# Patient Record
Sex: Female | Born: 1970 | Race: Black or African American | Hispanic: No | Marital: Married | Smoking: Never smoker
Health system: Southern US, Community
[De-identification: ages and names within clinical notes are randomized; demographics above are authoritative.]

---

## 2013-06-15 ENCOUNTER — Ambulatory Visit: Payer: Self-pay | Admitting: Family Medicine

## 2014-08-02 ENCOUNTER — Other Ambulatory Visit (HOSPITAL_BASED_OUTPATIENT_CLINIC_OR_DEPARTMENT_OTHER): Payer: Self-pay | Admitting: *Deleted

## 2014-08-02 ENCOUNTER — Ambulatory Visit (HOSPITAL_BASED_OUTPATIENT_CLINIC_OR_DEPARTMENT_OTHER): Payer: Self-pay

## 2014-08-02 ENCOUNTER — Ambulatory Visit
Admission: RE | Admit: 2014-08-02 | Discharge: 2014-08-02 | Disposition: A | Discharge status: Home / Self Care | Payer: BLUE CROSS/BLUE SHIELD | Source: Ambulatory Visit | Attending: Family Medicine | Admitting: Family Medicine

## 2014-08-02 ENCOUNTER — Other Ambulatory Visit: Payer: Self-pay | Admitting: Family Medicine

## 2014-08-02 DIAGNOSIS — Z1231 Encounter for screening mammogram for malignant neoplasm of breast: Secondary | ICD-10-CM

## 2015-07-13 ENCOUNTER — Other Ambulatory Visit: Payer: Self-pay | Admitting: Family Medicine

## 2015-07-13 DIAGNOSIS — Z1231 Encounter for screening mammogram for malignant neoplasm of breast: Secondary | ICD-10-CM

## 2015-08-03 ENCOUNTER — Ambulatory Visit
Admission: RE | Admit: 2015-08-03 | Discharge: 2015-08-03 | Disposition: A | Discharge status: Home / Self Care | Payer: BLUE CROSS/BLUE SHIELD | Source: Ambulatory Visit | Attending: Family Medicine | Admitting: Family Medicine

## 2015-08-03 DIAGNOSIS — Z1231 Encounter for screening mammogram for malignant neoplasm of breast: Secondary | ICD-10-CM | POA: Diagnosis present

## 2016-02-05 ENCOUNTER — Encounter: Payer: BLUE CROSS/BLUE SHIELD | Attending: Family Medicine | Admitting: Dietician

## 2016-02-05 ENCOUNTER — Encounter: Payer: Self-pay | Admitting: Dietician

## 2016-02-05 VITALS — BP 130/90 | Ht 62.0 in | Wt 220.4 lb

## 2016-02-05 DIAGNOSIS — E119 Type 2 diabetes mellitus without complications: Secondary | ICD-10-CM

## 2016-02-05 NOTE — Progress Notes (Signed)
Diabetes Self-Management Education  Visit Type: First/Initial  Appt. Start Time: 0930 Appt. End Time: 1030  02/05/2016  Ms. Bianca LawsYvette Romero, identified by name and date of birth, is a 45 y.o. female with a diagnosis of Diabetes: Type 2.   ASSESSMENT  Blood pressure 130/90, height 5\' 2"  (1.575 m), weight 220 lb 6.4 oz (100 kg). Body mass index is 40.31 kg/m.      Diabetes Self-Management Education - 02/05/16 0947      Visit Information   Visit Type First/Initial     Initial Visit   Diabetes Type Type 2   Are you currently following a meal plan? Yes  ketogenic diet   What type of meal plan do you follow? keto diet   Are you taking your medications as prescribed? 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 Demonstrates understanding / competency   Patient understands incorporating nutritional management into lifestyle. Needs Review   Patient undertands incorporating physical activity into lifestyle. Demonstrates understanding / competency   Patient understands using medications safely. Demonstrates understanding / competency   Patient understands monitoring blood glucose, interpreting and using results Needs Review   Patient understands prevention, detection, and treatment of acute complications. Needs Review   Patient understands prevention, detection, and treatment of chronic complications. Needs Review   Patient understands how to develop strategies to address psychosocial issues. Needs Review   Patient understands how to develop strategies to promote health/change behavior. Demonstrates understanding / competency     Complications    Last HgB A1C per patient/outside source 6.9 %   How often do you check your blood sugar? 1-2 times/day   Fasting Blood glucose range (mg/dL) [id number]   Number of hypoglycemic episodes per month 2   Can you tell when your blood sugar is low? Yes   What do you do if your blood sugar is low? eat something or drink water   Have you had a dilated eye exam in the past 12 months? Yes  10/2015   Have you had a dental exam in the past 12 months? Yes  6 months ago, appt today   Are you checking your feet? Yes   How many days per week are you checking your feet? 7     Dietary Intake   Breakfast egg whites with bacon or chicken or beef, coffee with "pure" sweeter (polyol) 1/8 tsp + 1/8tsp creamer   Lunch sometimes skips, sometimes 2-3pm: [place] breast slices (2), sometimes with slice cheese, [place] burger   Snack (afternoon)   1-2 snacks daily -- nuts, cheese   Dinner meat, broccoli, lettuce, other veg.    Snack (evening) sugar free jello   Beverage(s) water, propel, sparkling water, occasionally unsweet tea     Exercise   Exercise Type Light (walking / raking leaves)   How many days per week to you exercise? 3   How many minutes per day do you exercise? 45   Total minutes per week of exercise 135     Patient Education   Previous Diabetes Education Yes (please comment)  nursing school   Disease state  Other (comment)  pre-diabetes vs Type 2 diabetes  Nutrition management  Role of diet in the treatment of diabetes and the relationship between the three main macronutrients and blood glucose level;Reviewed blood glucose goals for pre and post meals and how to evaluate the patients' food intake on their blood glucose level.;Meal timing in regards to the patients' current diabetes medication.;Other (comment)  keto diet nutritional deficiencies, risks, side effects   Physical activity and exercise  Role of exercise on diabetes management, blood pressure control and cardiac health.   Monitoring Purpose  and frequency of SMBG.;Identified appropriate SMBG and/or A1C goals.;Daily foot exams   Personal strategies to promote health Lifestyle issues that need to be addressed for better diabetes care     Outcomes   Expected Outcomes Demonstrated interest in learning. Expect positive outcomes   Future DMSE 4-6 wks      Individualized Plan for Diabetes Self-Management Training:   Learning Objective:  Patient will have a greater understanding of diabetes self-management. Patient education plan is to attend individual and/or group sessions per assessed needs and concerns.  Patient has been following ketogenic diet for 2 weeks, and has lost about 6lbs. She is planning to continue with this diet for the time being; advised her on potential side effects, and importance of eventually liberalizing diet for long-term nutritional adequacy. Discussed nutrients provided by carbohydrate foods not in other foods and encouraged her to add healthy carb choices into her diet gradually. Advised her to contact MD if she experiences side effects.    Plan:   Patient Instructions   Consider taking a multivitamin to supplement keto diet and ensure adequate nutrition.   Continue to take in about 1200 calories daily.   Contact MD if you feel any side effects of keto diet which could include sleep problems, heart palpitations, dizziness, diarrhea, muscle cramping.   When ready, gradually increase carbohydrate intake by 15g every few days/ weeks to a minimum of 130 grams daily or 8-9 servings of carb foods. Start with fruits, beans, and/or whole grains like oatmeal, quinoa, brown/ wild rice, etc.    Expected Outcomes:  Demonstrated interest in learning. Expect positive outcomes  Education material provided: General Guidelines for Diabetes Management; Planning a Balanced Meal  If problems or questions, patient to contact team via:  Phone and Email  Future DSME appointment: 4-6 wks, patient will schedule

## 2016-02-05 NOTE — Patient Instructions (Signed)
   Consider taking a multivitamin to supplement keto diet and ensure adequate nutrition.   Continue to take in about 1200 calories daily.   Contact MD if you feel any side effects of keto diet which could include sleep problems, heart palpitations, dizziness, diarrhea, muscle cramping.   When ready, gradually increase carbohydrate intake by 15g every few days/ weeks to a minimum of 130 grams daily or 8-9 servings of carb foods. Start with fruits, beans, and/or whole grains like oatmeal, quinoa, brown/ wild rice, etc.

## 2016-04-10 ENCOUNTER — Telehealth: Payer: Self-pay | Admitting: Dietician

## 2016-04-10 NOTE — Telephone Encounter (Signed)
Have not heard from patient to schedule her follow-up visit; she was planning to call and schedule for either December of January. Called her and left a message requesting a call back.

## 2016-05-03 ENCOUNTER — Encounter: Payer: Self-pay | Admitting: Dietician

## 2016-05-03 NOTE — Progress Notes (Signed)
Patient has not scheduled follow-up visit as she planned to do by 04/2016. Sent discharge letter to MD.

## 2016-08-14 ENCOUNTER — Other Ambulatory Visit: Payer: Self-pay | Admitting: Family Medicine

## 2016-08-14 DIAGNOSIS — Z1231 Encounter for screening mammogram for malignant neoplasm of breast: Secondary | ICD-10-CM

## 2016-08-28 ENCOUNTER — Ambulatory Visit
Admission: RE | Admit: 2016-08-28 | Discharge: 2016-08-28 | Disposition: A | Discharge status: Home / Self Care | Payer: 59 | Source: Ambulatory Visit | Attending: Family Medicine | Admitting: Family Medicine

## 2016-08-28 DIAGNOSIS — Z1231 Encounter for screening mammogram for malignant neoplasm of breast: Secondary | ICD-10-CM | POA: Insufficient documentation

## 2017-08-21 ENCOUNTER — Other Ambulatory Visit: Payer: Self-pay | Admitting: Family Medicine

## 2017-08-21 DIAGNOSIS — Z1231 Encounter for screening mammogram for malignant neoplasm of breast: Secondary | ICD-10-CM

## 2017-09-08 ENCOUNTER — Encounter (INDEPENDENT_AMBULATORY_CARE_PROVIDER_SITE_OTHER): Payer: Self-pay

## 2017-09-08 ENCOUNTER — Ambulatory Visit
Admission: RE | Admit: 2017-09-08 | Discharge: 2017-09-08 | Disposition: A | Discharge status: Home / Self Care | Payer: 59 | Source: Ambulatory Visit | Attending: Family Medicine | Admitting: Family Medicine

## 2017-09-08 DIAGNOSIS — Z1231 Encounter for screening mammogram for malignant neoplasm of breast: Secondary | ICD-10-CM
